# Patient Record
Sex: Male | Born: 1985 | Race: White | Hispanic: No | Marital: Single | Smoking: Current every day smoker
Health system: Southern US, Community
[De-identification: ages and names within clinical notes are randomized; demographics above are authoritative.]

## PROBLEM LIST (undated history)

## (undated) DIAGNOSIS — B1910 Unspecified viral hepatitis B without hepatic coma: Secondary | ICD-10-CM

---

## 2016-11-22 ENCOUNTER — Encounter: Payer: Self-pay | Admitting: Emergency Medicine

## 2016-11-22 ENCOUNTER — Emergency Department: Payer: Self-pay

## 2016-11-22 ENCOUNTER — Emergency Department
Admission: EM | Admit: 2016-11-22 | Discharge: 2016-11-22 | Disposition: A | Discharge status: Home / Self Care | Payer: Self-pay | Attending: Emergency Medicine | Admitting: Emergency Medicine

## 2016-11-22 DIAGNOSIS — R1032 Left lower quadrant pain: Secondary | ICD-10-CM | POA: Insufficient documentation

## 2016-11-22 DIAGNOSIS — R52 Pain, unspecified: Secondary | ICD-10-CM

## 2016-11-22 DIAGNOSIS — R7989 Other specified abnormal findings of blood chemistry: Secondary | ICD-10-CM

## 2016-11-22 DIAGNOSIS — F17210 Nicotine dependence, cigarettes, uncomplicated: Secondary | ICD-10-CM | POA: Insufficient documentation

## 2016-11-22 DIAGNOSIS — R945 Abnormal results of liver function studies: Secondary | ICD-10-CM | POA: Insufficient documentation

## 2016-11-22 DIAGNOSIS — R109 Unspecified abdominal pain: Secondary | ICD-10-CM

## 2016-11-22 HISTORY — DX: Unspecified viral hepatitis B without hepatic coma: B19.10

## 2016-11-22 LAB — COMPREHENSIVE METABOLIC PANEL
ALBUMIN: 4.7 g/dL (ref 3.5–5.0)
ALT: 111 U/L — AB (ref 17–63)
ANION GAP: 4 — AB (ref 5–15)
AST: 53 U/L — ABNORMAL HIGH (ref 15–41)
Alkaline Phosphatase: 52 U/L (ref 38–126)
BUN: 15 mg/dL (ref 6–20)
CHLORIDE: 105 mmol/L (ref 101–111)
CO2: 29 mmol/L (ref 22–32)
Calcium: 9.5 mg/dL (ref 8.9–10.3)
Creatinine, Ser: 1.07 mg/dL (ref 0.61–1.24)
GFR calc non Af Amer: >60 mL/min (ref >60)
Glucose, Bld: 120 mg/dL — ABNORMAL HIGH (ref 65–99)
Potassium: 4.3 mmol/L (ref 3.5–5.1)
Sodium: 138 mmol/L (ref 135–145)
Total Bilirubin: 2.1 mg/dL — ABNORMAL HIGH (ref 0.3–1.2)
Total Protein: 7.7 g/dL (ref 6.5–8.1)

## 2016-11-22 LAB — URINALYSIS, COMPLETE (UACMP) WITH MICROSCOPIC
BACTERIA UA: NONE SEEN
Bilirubin Urine: NEGATIVE
GLUCOSE, UA: NEGATIVE mg/dL
Hgb urine dipstick: NEGATIVE
KETONES UR: NEGATIVE mg/dL
Leukocytes, UA: NEGATIVE
Nitrite: NEGATIVE
PROTEIN: NEGATIVE mg/dL
Specific Gravity, Urine: 1.018 (ref 1.005–1.030)
pH: 7 (ref 5.0–8.0)

## 2016-11-22 LAB — CBC
HCT: 40.4 % (ref 40.0–52.0)
HEMOGLOBIN: 13.8 g/dL (ref 13.0–18.0)
MCH: 30.6 pg (ref 26.0–34.0)
MCHC: 34.3 g/dL (ref 32.0–36.0)
MCV: 89.2 fL (ref 80.0–100.0)
Platelets: 204 10*3/uL (ref 150–440)
RBC: 4.53 MIL/uL (ref 4.40–5.90)
RDW: 13 % (ref 11.5–14.5)
WBC: 6.4 10*3/uL (ref 3.8–10.6)

## 2016-11-22 LAB — LIPASE, BLOOD: LIPASE: 18 U/L (ref 11–51)

## 2016-11-22 MED ORDER — ACETAMINOPHEN 500 MG PO TABS
1000.0000 mg | ORAL_TABLET | Freq: Once | ORAL | Status: DC
  Filled 2016-11-22: qty 2

## 2016-11-22 NOTE — ED Notes (Signed)
Patient transported to Ultrasound 

## 2016-11-22 NOTE — Discharge Instructions (Signed)

## 2016-11-22 NOTE — ED Provider Notes (Signed)
[facility] Emergency Department Provider Note  ____________________________________________  Time seen: Approximately 6:44 PM  I have reviewed the triage vital signs and the nursing notes.   HISTORY  Chief Complaint Abdominal Pain   HPI Glen Riley is a 31 y.o. male with a history of former IV drug abuse and hepatitis B who presents for evaluation of left abdominal pain. Patient reports that he was driving to work when he developed sudden onset of sharp left lower quadrant abdominal pain. He became very anxious and started to hyperventilate. He then reports that he felt a tingling sensation all over his body and felt like he was going to pass out, he became sweaty and had tunnel vision. He pulled over and decided to come to the emergency room to be evaluated. Now he feels like his back to his baseline. He is still complaining of mild 2 out of 10 sharp pain located in the left side of his abdomen, that has been constant and nonradiating since its onset. He denies ever having similar pain. Patient reports he was diagnosed hepatitis B at a rehabilitation facility a year ago. Has not undergone any treatment for it. He denies continue IV drug use. He endorses occasional alcohol use. Denies any drug use. Denies any penile discharge, dysuria, hematuria, nausea, vomiting, diarrhea, constipation, chest pain, shortness of breath, fever or chills.  Past Medical History:  Diagnosis Date  . Hepatitis B     There are no active problems to display for this patient.   History reviewed. No pertinent surgical history.  Prior to Admission medications   Not on File    Allergies Patient has no known allergies.  No family history on file.  Social History Social History  Substance Use Topics  . Smoking status: Current Every Day Smoker    Types: E-cigarettes  . Smokeless tobacco: Never Used  . Alcohol use No    Review of Systems  Constitutional: Negative  for fever. Eyes: Negative for visual changes. ENT: Negative for sore throat. Neck: No neck pain  Cardiovascular: Negative for chest pain. Respiratory: Negative for shortness of breath. Gastrointestinal: + left sided abdominal pain. No vomiting or diarrhea. Genitourinary: Negative for dysuria. Musculoskeletal: Negative for back pain. Skin: Negative for rash. Neurological: Negative for headaches, weakness or numbness. Psych: No SI or HI  ____________________________________________   PHYSICAL EXAM:  VITAL SIGNS: ED Triage Vitals [11/22/16 1750]  Enc Vitals Group     BP (!) 141/71     Pulse Rate 77     Resp 16     Temp 97.8 F (36.6 C)     Temp Source Oral     SpO2 100 %     Weight 175 lb (79.4 kg)     Height 5\' 11"  (1.803 m)     Head Circumference      Peak Flow      Pain Score 4     Pain Loc      Pain Edu?      Excl. in GC?     Constitutional: Alert and oriented. Well appearing and in no apparent distress. HEENT:      Head: Normocephalic and atraumatic.         Eyes: Conjunctivae are normal. Sclera is non-icteric. EOMI. PERRL      Mouth/Throat: Mucous membranes are moist.       Neck: Supple with no signs of meningismus. Cardiovascular: Regular rate and rhythm. No murmurs, gallops, or rubs. 2+ symmetrical distal pulses are  present in all extremities. No JVD. Respiratory: Normal respiratory effort. Lungs are clear to auscultation bilaterally. No wheezes, crackles, or rhonchi.  Gastrointestinal: Soft, mild ttp over the L quadrants, and non distended with positive bowel sounds. No rebound or guarding. Genitourinary: No CVA tenderness. Bilateral testicles are descended with no tenderness to palpation, bilateral positive cremasteric reflexes are present, no swelling or erythema of the scrotum. No evidence of inguinal hernia. Musculoskeletal: Nontender with normal range of motion in all extremities. No edema, cyanosis, or erythema of extremities. Neurologic: Normal speech and  language. Face is symmetric. Moving all extremities. No gross focal neurologic deficits are appreciated. Skin: Skin is warm, dry and intact. No rash noted. Psychiatric: Mood and affect are normal. Speech and behavior are normal.  ____________________________________________   LABS (all labs ordered are listed, but only abnormal results are displayed)  Labs Reviewed  COMPREHENSIVE METABOLIC PANEL - Abnormal; Notable for the following:       Result Value   Glucose, Bld 120 (*)    AST 53 (*)    ALT 111 (*)    Total Bilirubin 2.1 (*)    Anion gap 4 (*)    All other components within normal limits  URINALYSIS, COMPLETE (UACMP) WITH MICROSCOPIC - Abnormal; Notable for the following:    Color, Urine YELLOW (*)    APPearance CLEAR (*)    Squamous Epithelial / LPF 0-5 (*)    All other components within normal limits  LIPASE, BLOOD  CBC   ____________________________________________  EKG  ED ECG REPORT I, Nita Sickle, the attending physician, personally viewed and interpreted this ECG.  Normal sinus rhythm, rate of 84, incomplete right bundle branch block, normal PR and QTc intervals, normal axis, no ST elevations or depressions. No prior for comparison ____________________________________________  RADIOLOGY  RUQ [place]: negative  Renal US: negative ____________________________________________   PROCEDURES  Procedure(s) performed: None Procedures Critical Care performed:  None ____________________________________________   INITIAL IMPRESSION / ASSESSMENT AND PLAN / ED COURSE   31 y.o. male with a history of former IV drug abuse and hepatitis B who presents for evaluation of sudden onset of left abdominal pain currently 2/10. Patient is extremely well appearing, no distress, vital signs are within normal limits, he has mild tenderness to palpation on the left quadrants with no rebound or guarding, GU exam is within normal limits, no CVA tenderness, no right upper quadrant  or epigastric tenderness. Blood work showing mildly elevated LFTs with AST of 53, ALT of 111 and T bili of 2.1. Lipase and white count are within normal limits. UA with no evidence of UTI or blood. We'll send patient for right upper quadrant ultrasound due to elevated LFTs and T bili. No blood in the urine, no UTI and mild symptoms, will get Renal US to rule out hydronephrosis. No evidence of inguinal hernia or testicular torsion.   Clinical Course as of Nov 22 2018  Thu Nov 22, 2016  2009 Renal ultrasound with no evidence of hydronephrosis. Gallbladder ultrasound with no acute findings. Patient's pain has fully resolved. Serial abdominal exams with no tenderness to palpation. We'll discharge patient home at this time.  [CV]    Clinical Course User Index [CV] Nita Sickle, MD    Pertinent labs & imaging results that were available during my care of the patient were reviewed by me and considered in my medical decision making (see chart for details).    ____________________________________________   FINAL CLINICAL IMPRESSION(S) / ED DIAGNOSES  Final diagnoses:  Elevated LFTs  Abdominal pain, unspecified abdominal location      NEW MEDICATIONS STARTED DURING THIS VISIT:  New Prescriptions   No medications on file     Note:  This document was prepared using Dragon voice recognition software and may include unintentional dictation errors.    Nita Sickle, MD 11/22/16 2020

## 2016-11-22 NOTE — ED Triage Notes (Signed)
Diagnosed with Hepatitis B one year ago.  Tonight while driving, left side started to hurt, had a hot flash, tingly all over.  Here to be evaluated.

## 2018-04-18 IMAGING — US US RENAL
1 series · 14 of 25 positions shown · non-contrast
Comparison: None.

CLINICAL DATA: Left flank pain

EXAM:
RENAL / URINARY TRACT ULTRASOUND COMPLETE

[Series 1: us renal · 0.22mm/px · 14 of 38 slices shown]
[im 1/38]
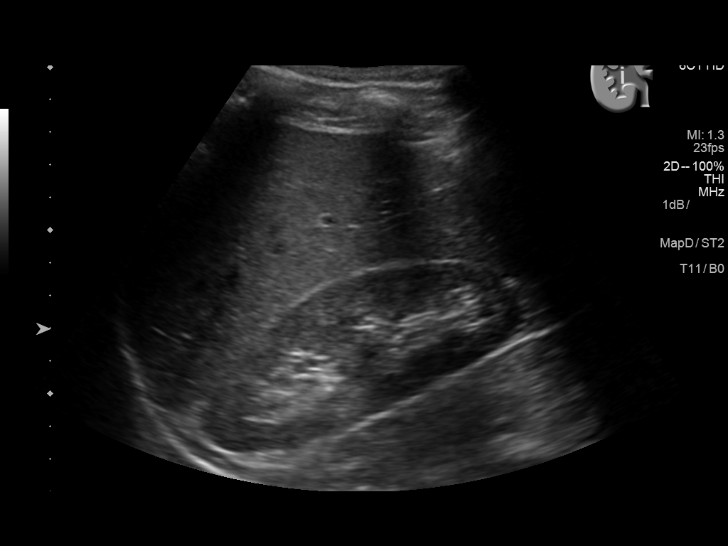
[im 4/38]
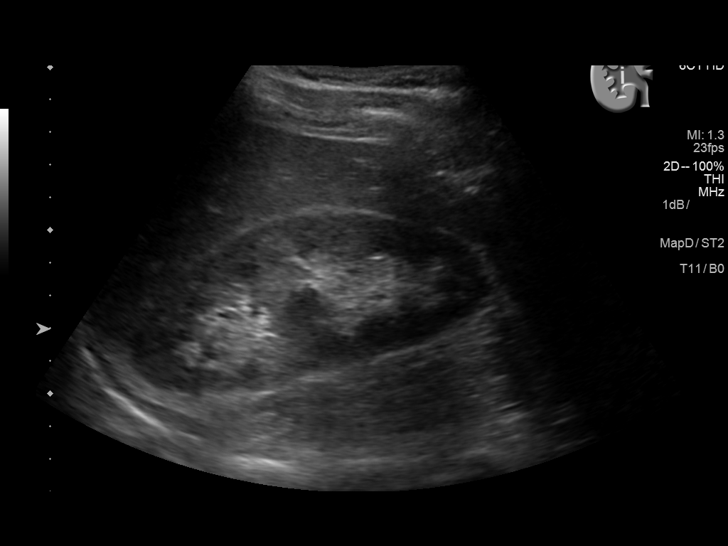
[im 7/38]
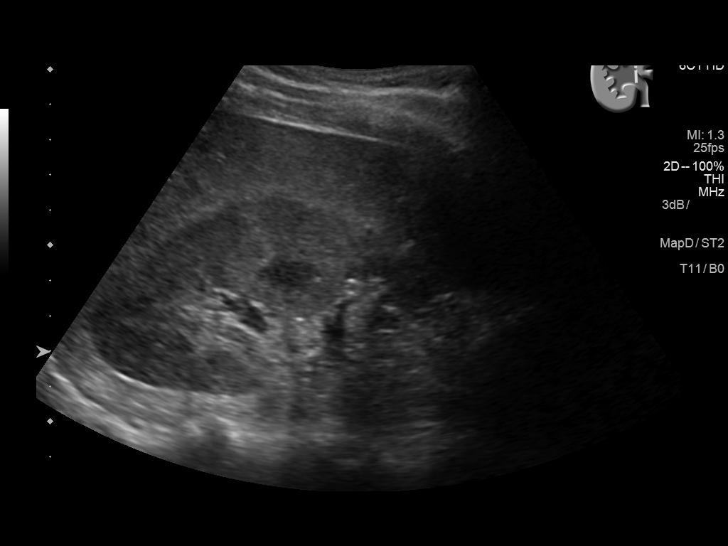
[im 10/38]
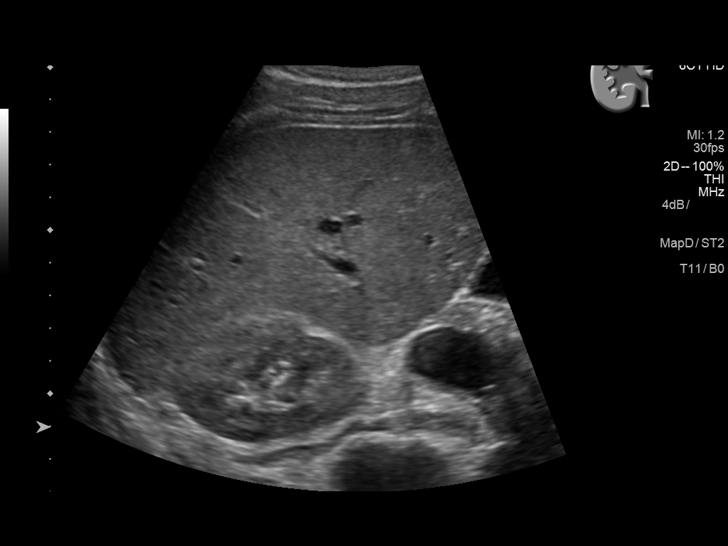
[im 13/38]
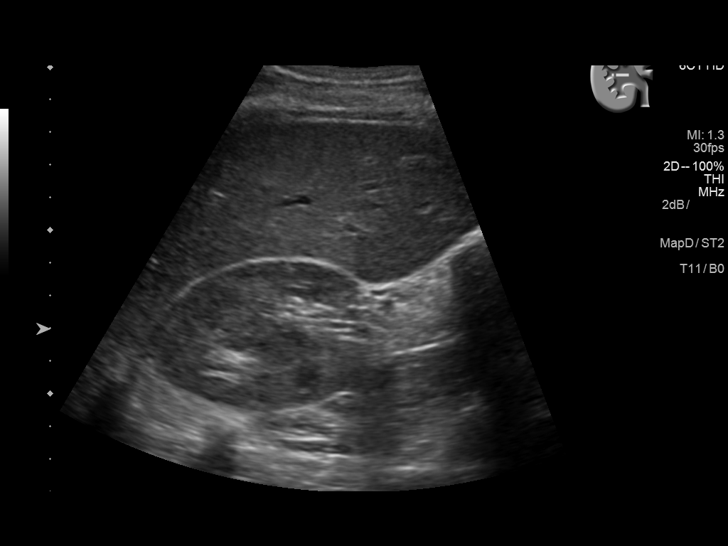
[im 14/38]
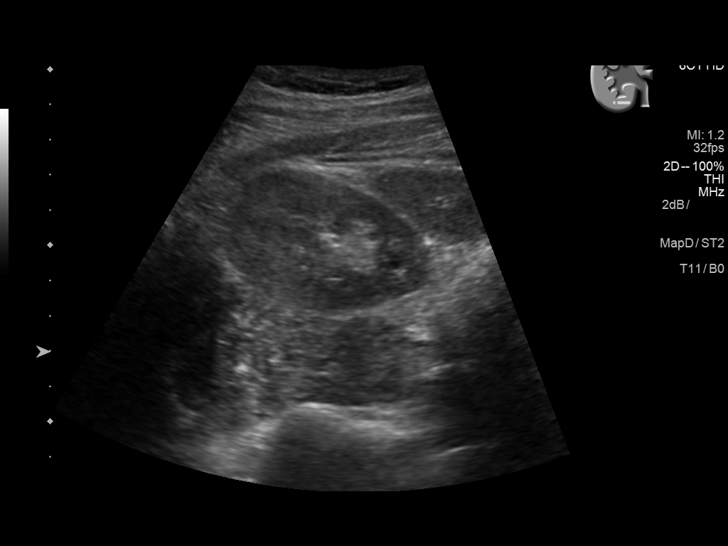
[im 17/38]
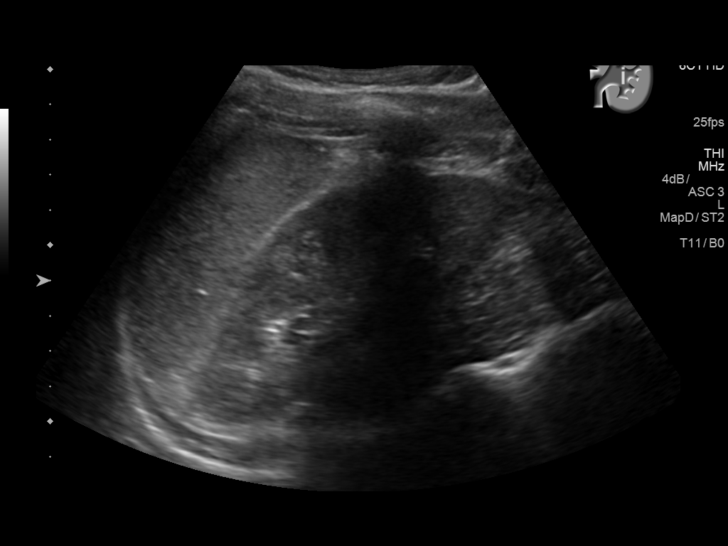
[im 21/38]
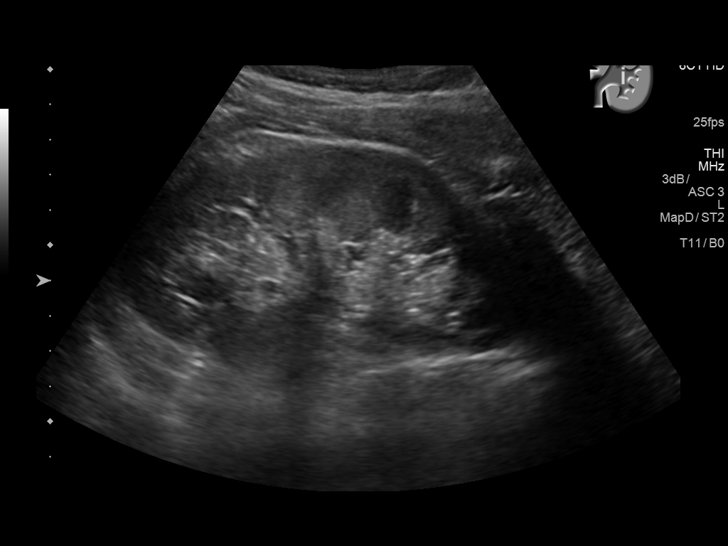
[im 24/38]
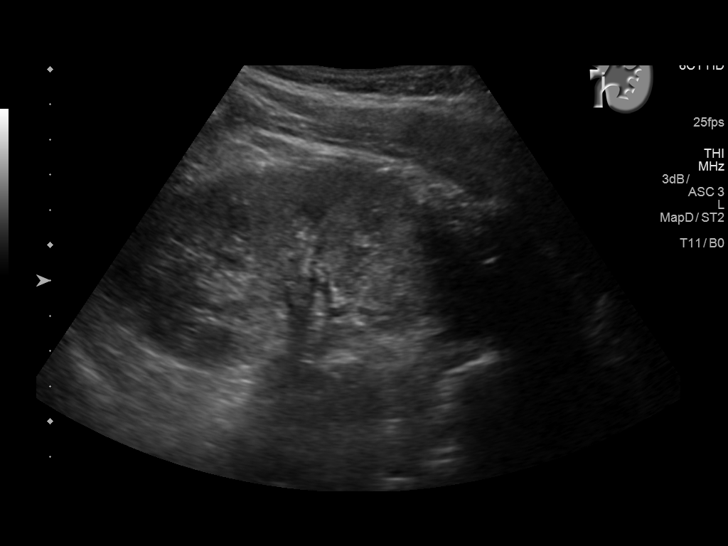
[im 25/38]
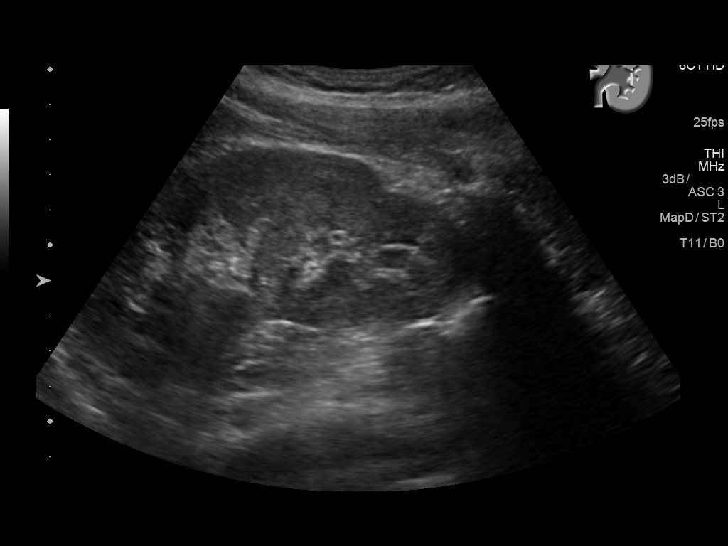
[im 28/38]
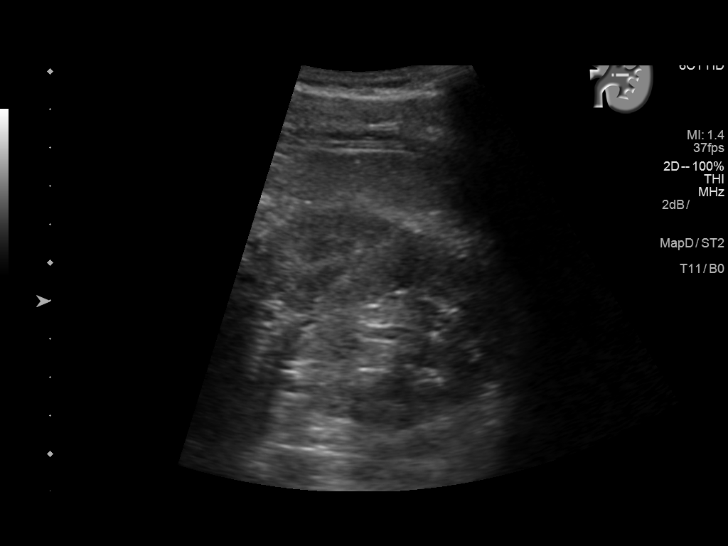
[im 31/38]
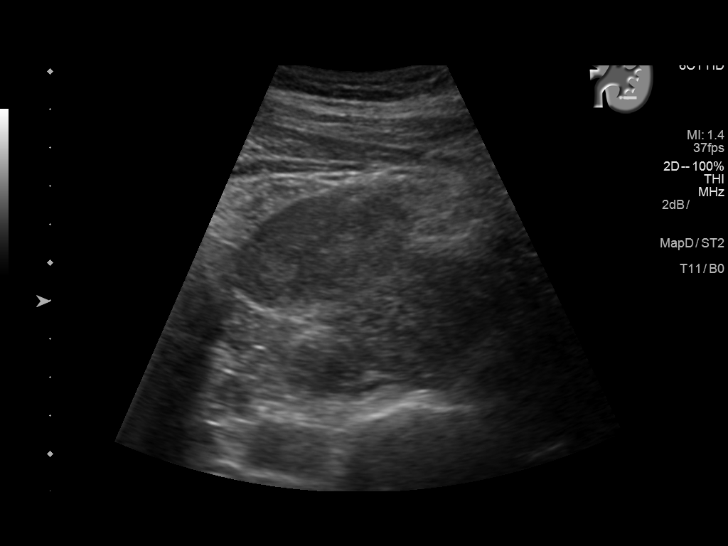
[im 34/38]
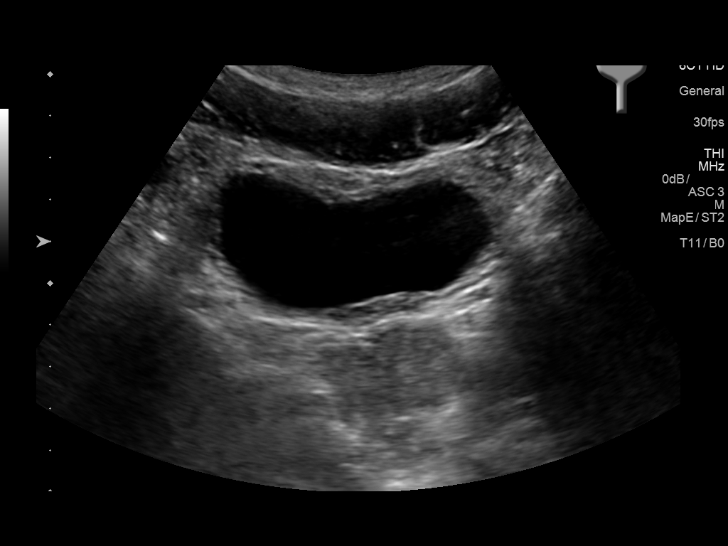
[im 38/38]
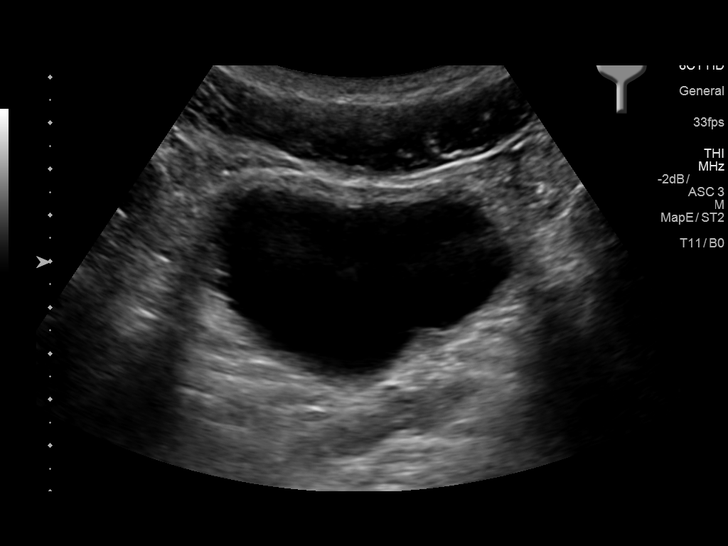

[14 of 25 positions shown; findings below may reference images not displayed]

FINDINGS: Right Kidney:

Length: 11.2 cm. Echogenicity within normal limits. No mass or
hydronephrosis visualized. Possible duplicated intrarenal collecting
system.

Left Kidney:

Length: 11.7 cm. Echogenicity within normal limits. No mass or
hydronephrosis visualized.

Bladder:

Appears normal for degree of bladder distention.
IMPRESSION: Unremarkable urinary tract ultrasound.

## 2018-04-18 IMAGING — US US ABDOMEN LIMITED
1 series · 14 of 25 positions shown · non-contrast
Comparison: None.

CLINICAL DATA: Left flank pain, dizziness

EXAM:
US ABDOMEN LIMITED - RIGHT UPPER QUADRANT

[Series 1: us abdomen limited · 0.17mm/px · 14 of 46 slices shown]
[im 1/46]
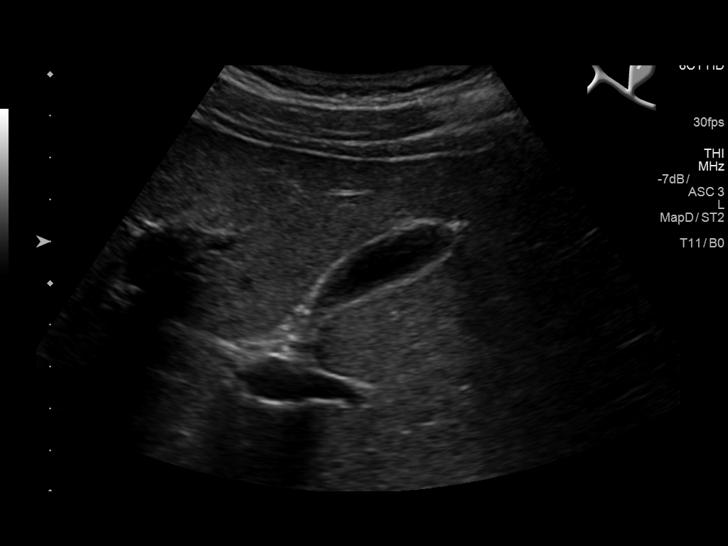
[im 4/46]
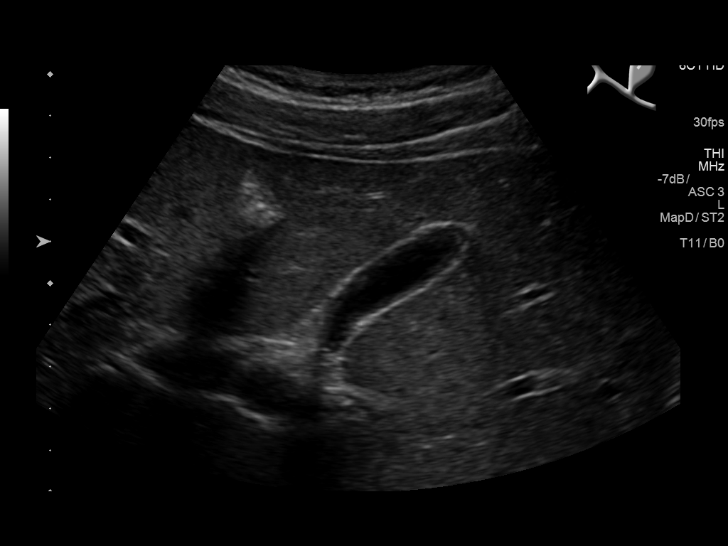
[im 8/46]
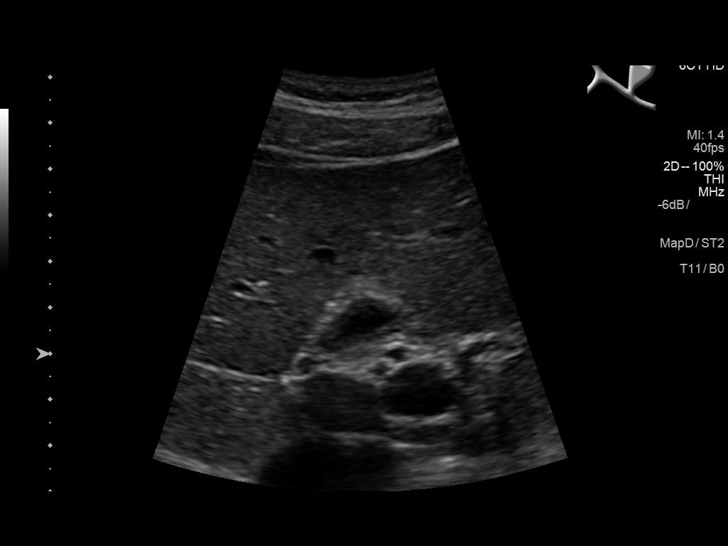
[im 12/46]
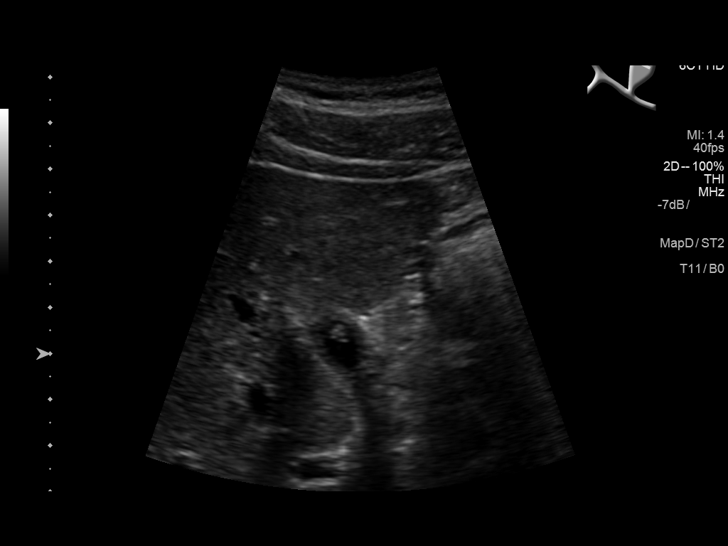
[im 16/46]
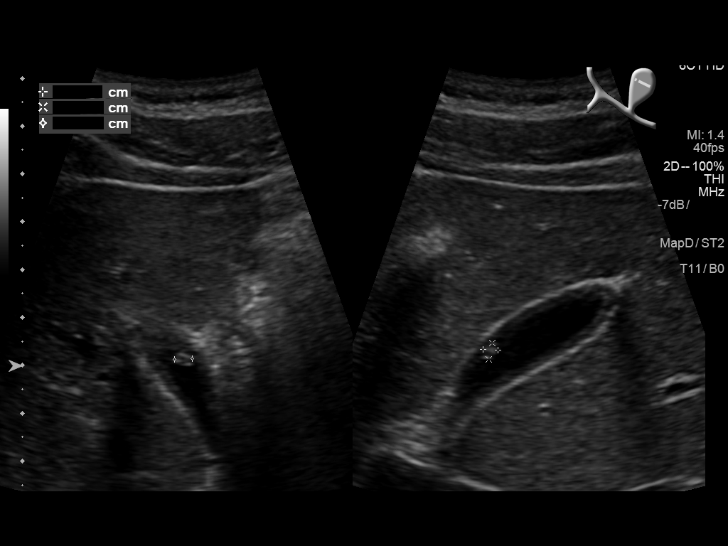
[im 17/46]
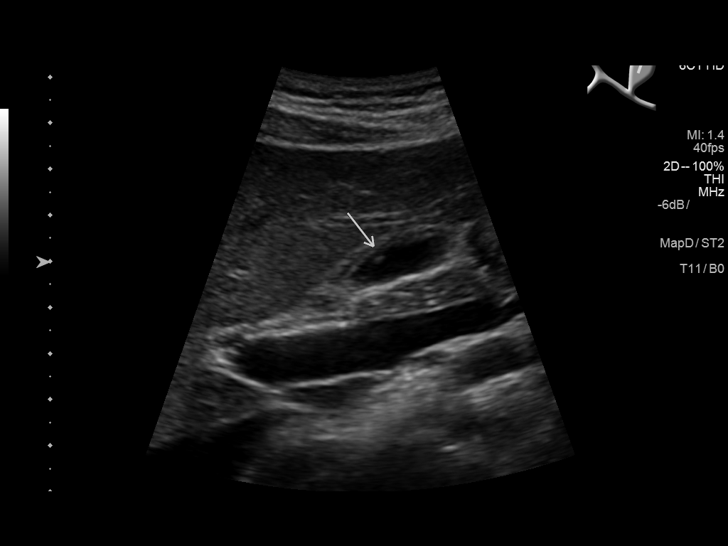
[im 21/46]
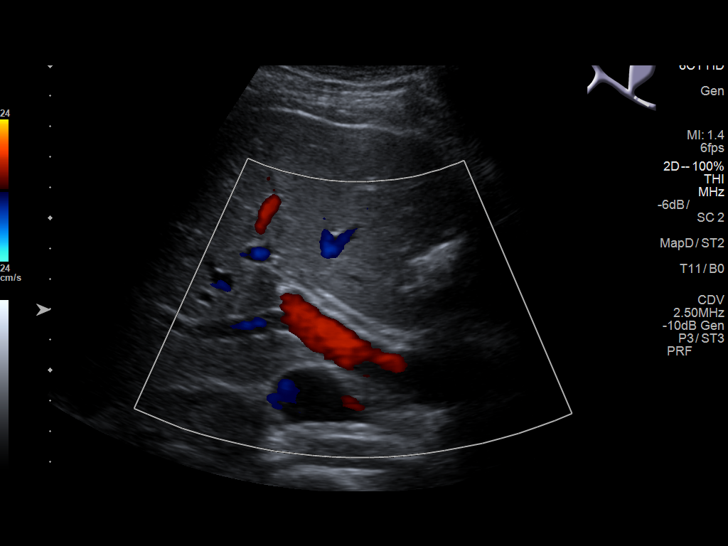
[im 25/46]
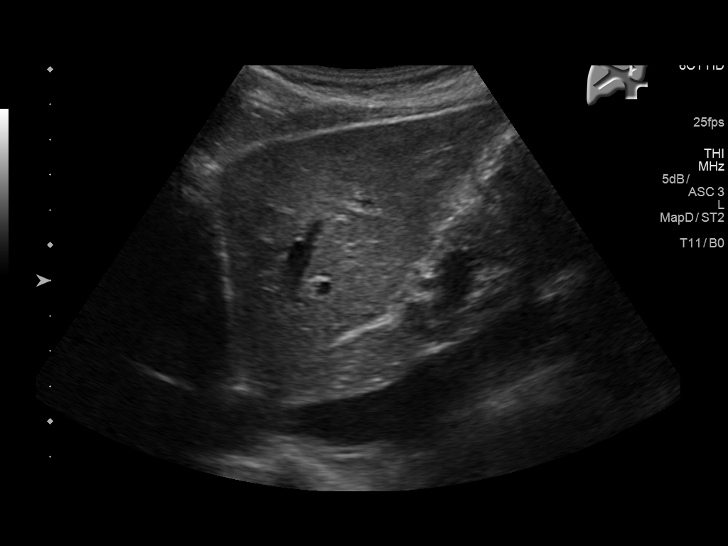
[im 29/46]
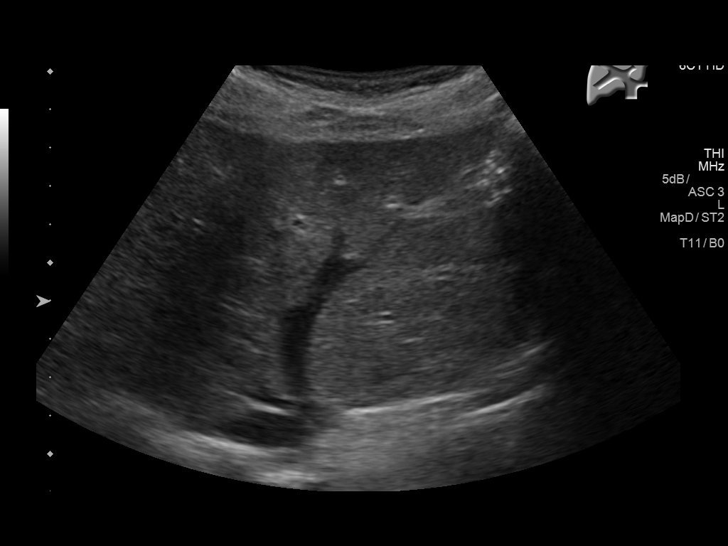
[im 31/46]
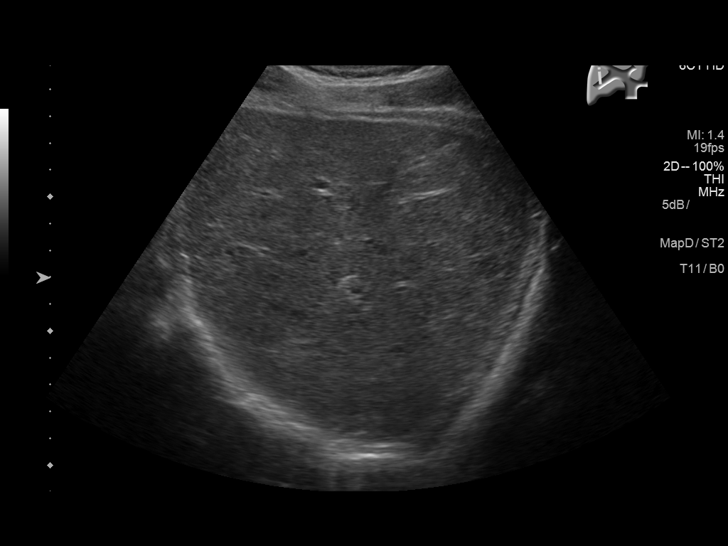
[im 34/46]
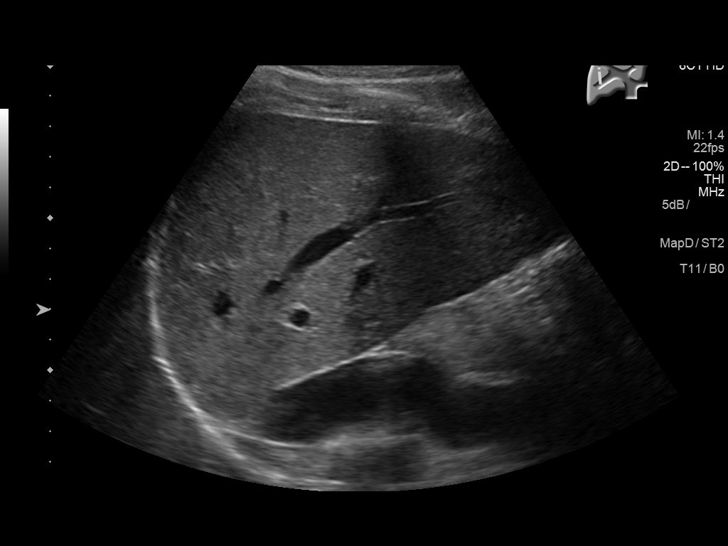
[im 38/46]
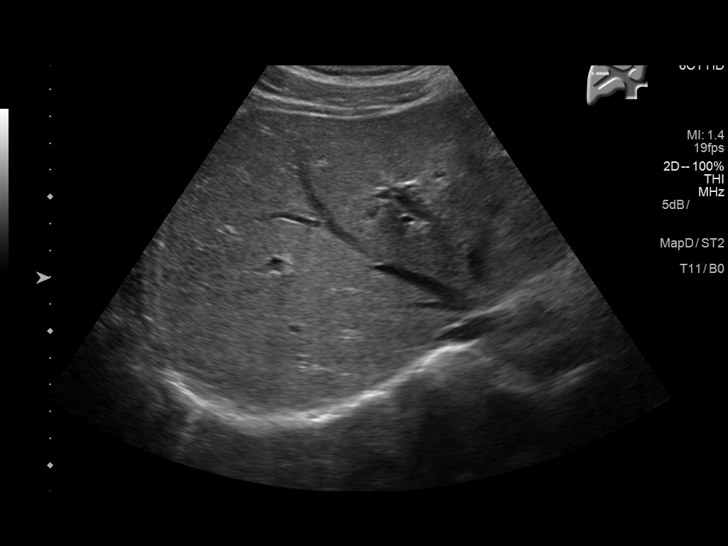
[im 42/46]
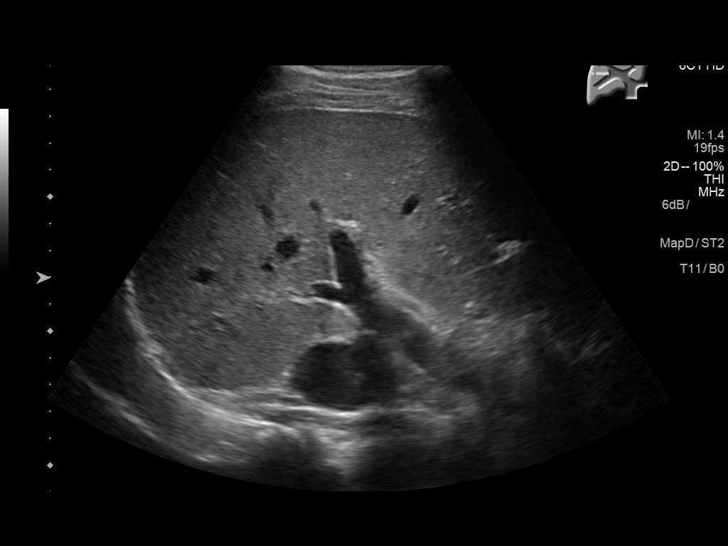
[im 46/46]
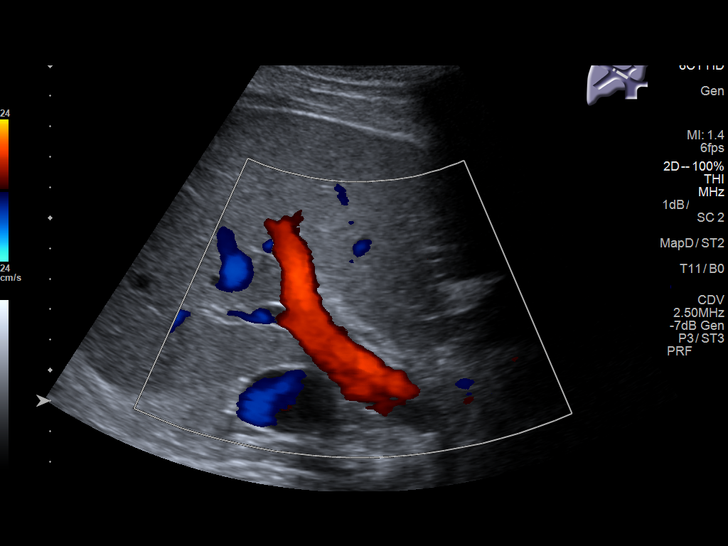

[14 of 25 positions shown; findings below may reference images not displayed]

FINDINGS: Gallbladder:

Gallbladder is incompletely distended. No shadowing calculi. Several
gallbladder wall polyps measured up to 4 mm. There is no gallbladder
wall thickening. No pericholecystic fluid. Sonographer describes no
sonographic Murphy's sign.

Common bile duct:

Diameter:  2.6 mm, unremarkable

Liver:

No focal lesion identified. Within normal limits in parenchymal
echogenicity.
IMPRESSION: 1. No acute findings.
2. Small gallbladder wall polyps, likely of no clinical
significance.
